# Patient Record
Sex: Male | Born: 1995 | Race: White | Hispanic: No | Marital: Single | Smoking: Never smoker
Health system: Southern US, Community
[De-identification: ages and names within clinical notes are randomized; demographics above are authoritative.]

## PROBLEM LIST (undated history)

## (undated) DIAGNOSIS — L309 Dermatitis, unspecified: Secondary | ICD-10-CM

## (undated) DIAGNOSIS — R51 Headache: Secondary | ICD-10-CM

## (undated) DIAGNOSIS — J45909 Unspecified asthma, uncomplicated: Secondary | ICD-10-CM

## (undated) DIAGNOSIS — Z87442 Personal history of urinary calculi: Secondary | ICD-10-CM

## (undated) HISTORY — DX: Dermatitis, unspecified: L30.9

## (undated) HISTORY — DX: Personal history of urinary calculi: Z87.442

## (undated) HISTORY — DX: Headache: R51

---
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
## 2016-09-09 ENCOUNTER — Encounter: Payer: Self-pay | Admitting: Family Medicine

## 2016-09-09 ENCOUNTER — Ambulatory Visit: Payer: Self-pay | Admitting: Family Medicine

## 2016-09-09 ENCOUNTER — Ambulatory Visit (INDEPENDENT_AMBULATORY_CARE_PROVIDER_SITE_OTHER): Payer: BLUE CROSS/BLUE SHIELD | Admitting: Family Medicine

## 2016-09-09 VITALS — BP 104/80 | HR 98 | Temp 98.1°F | Wt 292.0 lb

## 2016-09-09 DIAGNOSIS — R51 Headache: Secondary | ICD-10-CM | POA: Diagnosis not present

## 2016-09-09 DIAGNOSIS — R509 Fever, unspecified: Secondary | ICD-10-CM | POA: Diagnosis not present

## 2016-09-09 DIAGNOSIS — J020 Streptococcal pharyngitis: Secondary | ICD-10-CM | POA: Diagnosis not present

## 2016-09-09 DIAGNOSIS — R519 Headache, unspecified: Secondary | ICD-10-CM

## 2016-09-09 DIAGNOSIS — R197 Diarrhea, unspecified: Secondary | ICD-10-CM | POA: Diagnosis not present

## 2016-09-09 LAB — CBC WITH DIFFERENTIAL/PLATELET
BASOS ABS: 0 10*3/uL (ref 0.0–0.1)
Basophils Relative: 0.3 % (ref 0.0–3.0)
EOS PCT: 0.5 % (ref 0.0–5.0)
Eosinophils Absolute: 0 10*3/uL (ref 0.0–0.7)
HCT: 44.6 % (ref 36.0–49.0)
Hemoglobin: 15.4 g/dL (ref 12.0–16.0)
Lymphocytes Relative: 15.2 % — ABNORMAL LOW (ref 24.0–48.0)
Lymphs Abs: 1.1 10*3/uL (ref 0.7–4.0)
MCHC: 34.6 g/dL (ref 31.0–37.0)
MCV: 85.2 fl (ref 78.0–98.0)
MONOS PCT: 9 % (ref 3.0–12.0)
Monocytes Absolute: 0.6 10*3/uL (ref 0.1–1.0)
NEUTROS ABS: 5.3 10*3/uL (ref 1.4–7.7)
Neutrophils Relative %: 75 % — ABNORMAL HIGH (ref 43.0–71.0)
PLATELETS: 274 10*3/uL (ref 150.0–575.0)
RBC: 5.24 Mil/uL (ref 3.80–5.70)
RDW: 13.4 % (ref 11.4–15.5)
WBC: 7.1 10*3/uL (ref 4.5–13.5)

## 2016-09-09 LAB — COMPREHENSIVE METABOLIC PANEL
ALK PHOS: 118 U/L (ref 52–171)
ALT: 24 U/L (ref 0–53)
AST: 20 U/L (ref 0–37)
Albumin: 4.4 g/dL (ref 3.5–5.2)
BILIRUBIN TOTAL: 0.8 mg/dL (ref 0.3–1.2)
BUN: 11 mg/dL (ref 6–23)
CALCIUM: 9.2 mg/dL (ref 8.4–10.5)
CO2: 27 meq/L (ref 19–32)
Chloride: 100 mEq/L (ref 96–112)
Creatinine, Ser: 0.89 mg/dL (ref 0.40–1.50)
GFR: 117.11 mL/min (ref >60.00)
Glucose, Bld: 99 mg/dL (ref 70–99)
Potassium: 3.6 mEq/L (ref 3.5–5.1)
Sodium: 135 mEq/L (ref 135–145)
TOTAL PROTEIN: 7.5 g/dL (ref 6.0–8.3)

## 2016-09-09 LAB — POCT RAPID STREP A (OFFICE): Rapid Strep A Screen: POSITIVE — AB

## 2016-09-09 MED ORDER — AMOXICILLIN 500 MG PO CAPS: 500.0000 mg | ORAL_CAPSULE | Freq: Two times a day (BID) | ORAL | 0 refills | Status: DC

## 2016-09-09 NOTE — Patient Instructions (Addendum)
Drink a sports drink mixed with water (1:1 ratio)  Drink 1-2 ounces every 15 to 30 minutes until your stomach is feeling better and you can tolerate larger quantities Eat easy to digest foods once your stomach is feeling better If you are not feeling better in 24 hours, please call the office Please get in two doses of your antibiotic today and be sure to finish all of it   Strep Throat Strep throat is a bacterial infection of the throat. Your health care provider may call the infection tonsillitis or pharyngitis, depending on whether there is swelling in the tonsils or at the back of the throat. Strep throat is most common during the cold months of the year in children who are 65-54 years of age, but it can happen during any season in people of any age. This infection is spread from person to person (contagious) through coughing, sneezing, or close contact. CAUSES Strep throat is caused by the bacteria called Streptococcus pyogenes. RISK FACTORS This condition is more likely to develop in:  People who spend time in crowded places where the infection can spread easily.  People who have close contact with someone who has strep throat. SYMPTOMS Symptoms of this condition include:  Fever or chills.   Redness, swelling, or pain in the tonsils or throat.  Pain or difficulty when swallowing.  White or yellow spots on the tonsils or throat.  Swollen, tender glands in the neck or under the jaw.  Red rash all over the body (rare). DIAGNOSIS This condition is diagnosed by performing a rapid strep test or by taking a swab of your throat (throat culture test). Results from a rapid strep test are usually ready in a few minutes, but throat culture test results are available after one or two days. TREATMENT This condition is treated with antibiotic medicine. HOME CARE INSTRUCTIONS Medicines  Take over-the-counter and prescription medicines only as told by your health care provider.  Take  your antibiotic as told by your health care provider. Do not stop taking the antibiotic even if you start to feel better.  Have family members who also have a sore throat or fever tested for strep throat. They may need antibiotics if they have the strep infection. Eating and Drinking  Do not share food, drinking cups, or personal items that could cause the infection to spread to other people.  If swallowing is difficult, try eating soft foods until your sore throat feels better.  Drink enough fluid to keep your urine clear or pale yellow. General Instructions  Gargle with a salt-water mixture 3-4 times per day or as needed. To make a salt-water mixture, completely dissolve -1 tsp of salt in 1 cup of warm water.  Make sure that all household members wash their hands well.  Get plenty of rest.  Stay home from school or work until you have been taking antibiotics for 24 hours.  Keep all follow-up visits as told by your health care provider. This is important. SEEK MEDICAL CARE IF:  The glands in your neck continue to get bigger.  You develop a rash, cough, or earache.  You cough up a thick liquid that is green, yellow-brown, or bloody.  You have pain or discomfort that does not get better with medicine.  Your problems seem to be getting worse rather than better.  You have a fever. SEEK IMMEDIATE MEDICAL CARE IF:  You have new symptoms, such as vomiting, severe headache, stiff or painful neck, chest pain, or  shortness of breath.  You have severe throat pain, drooling, or changes in your voice.  You have swelling of the neck, or the skin on the neck becomes red and tender.  You have signs of dehydration, such as fatigue, dry mouth, and decreased urination.  You become increasingly sleepy, or you cannot wake up completely.  Your joints become red or painful.   This information is not intended to replace advice given to you by your health care provider. Make sure you discuss  any questions you have with your health care provider.   Document Released: 10/23/2000 Document Revised: 07/17/2015 Document Reviewed: 02/18/2015 Elsevier Interactive Patient Education 2016 [employer]   Food Choices to Help Relieve Diarrhea, Adult When you have diarrhea, the foods you eat and your eating habits are very important. Choosing the right foods and drinks can help relieve diarrhea. Also, because diarrhea can last up to 7 days, you need to replace lost fluids and electrolytes (such as sodium, potassium, and chloride) in order to help prevent dehydration.  WHAT GENERAL GUIDELINES DO I NEED TO FOLLOW?  Slowly drink 1 cup (8 oz) of fluid for each episode of diarrhea. If you are getting enough fluid, your urine will be clear or pale yellow.  Eat starchy foods. Some good choices include white rice, white toast, pasta, low-fiber cereal, baked potatoes (without the skin), saltine crackers, and bagels.  Avoid large servings of any cooked vegetables.  Limit fruit to two servings per day. A serving is  cup or 1 small piece.  Choose foods with less than 2 g of fiber per serving.  Limit fats to less than 8 tsp (38 g) per day.  Avoid fried foods.  Eat foods that have probiotics in them. Probiotics can be found in certain dairy products.  Avoid foods and beverages that may increase the speed at which food moves through the stomach and intestines (gastrointestinal tract). Things to avoid include:  High-fiber foods, such as dried fruit, raw fruits and vegetables, nuts, seeds, and whole grain foods.  Spicy foods and high-fat foods.  Foods and beverages sweetened with high-fructose corn syrup, honey, or sugar alcohols such as xylitol, sorbitol, and mannitol. WHAT FOODS ARE RECOMMENDED? Grains White rice. White, [place]French, or pita breads (fresh or toasted), including plain rolls, buns, or bagels. White pasta. Saltine, soda, or graham crackers. Pretzels. Low-fiber cereal. Cooked cereals  made with water (such as cornmeal, farina, or cream cereals). Plain muffins. Matzo. Melba toast. Zwieback.  Vegetables Potatoes (without the skin). Strained tomato and vegetable juices. Most well-cooked and canned vegetables without seeds. Tender lettuce. Fruits Cooked or canned applesauce, apricots, cherries, fruit cocktail, grapefruit, peaches, pears, or plums. Fresh bananas, apples without skin, cherries, grapes, cantaloupe, grapefruit, peaches, oranges, or plums.  Meat and Other Protein Products Baked or boiled chicken. Eggs. Tofu. Fish. Seafood. Smooth peanut butter. Ground or well-cooked tender beef, ham, veal, lamb, pork, or poultry.  Dairy Plain yogurt, kefir, and unsweetened liquid yogurt. Lactose-free milk, buttermilk, or soy milk. Plain hard cheese. Beverages Sport drinks. Clear broths. Diluted fruit juices (except prune). Regular, caffeine-free sodas such as ginger ale. Water. Decaffeinated teas. Oral rehydration solutions. Sugar-free beverages not sweetened with sugar alcohols. Other Bouillon, broth, or soups made from recommended foods.  The items listed above may not be a complete list of recommended foods or beverages. Contact your dietitian for more options. WHAT FOODS ARE NOT RECOMMENDED? Grains Whole grain, whole wheat, bran, or rye breads, rolls, pastas, crackers, and cereals. Wild or brown  rice. Cereals that contain more than 2 g of fiber per serving. Corn tortillas or taco shells. Cooked or dry oatmeal. Granola. Popcorn. Vegetables Raw vegetables. Cabbage, broccoli, Brussels sprouts, artichokes, baked beans, beet greens, corn, kale, legumes, peas, sweet potatoes, and yams. Potato skins. Cooked spinach and cabbage. Fruits Dried fruit, including raisins and dates. Raw fruits. Stewed or dried prunes. Fresh apples with skin, apricots, mangoes, pears, raspberries, and strawberries.  Meat and Other Protein Products Chunky peanut butter. Nuts and seeds. Beans and lentils. Tomasa BlaseBacon.    Dairy High-fat cheeses. Milk, chocolate milk, and beverages made with milk, such as milk shakes. Cream. Ice cream. Sweets and Desserts Sweet rolls, doughnuts, and sweet breads. Pancakes and waffles. Fats and Oils Butter. Cream sauces. Margarine. Salad oils. Plain salad dressings. Olives. Avocados.  Beverages Caffeinated beverages (such as coffee, tea, soda, or energy drinks). Alcoholic beverages. Fruit juices with pulp. Prune juice. Soft drinks sweetened with high-fructose corn syrup or sugar alcohols. Other Coconut. Hot sauce. Chili powder. Mayonnaise. Gravy. Cream-based or milk-based soups.  The items listed above may not be a complete list of foods and beverages to avoid. Contact your dietitian for more information. WHAT SHOULD I DO IF I BECOME DEHYDRATED? Diarrhea can sometimes lead to dehydration. Signs of dehydration include dark urine and dry mouth and skin. If you think you are dehydrated, you should rehydrate with an oral rehydration solution. These solutions can be purchased at pharmacies, retail stores, or online.  Drink -1 cup (120-240 mL) of oral rehydration solution each time you have an episode of diarrhea. If drinking this amount makes your diarrhea worse, try drinking smaller amounts more often. For example, drink 1-3 tsp (5-15 mL) every 5-10 minutes.  A general rule for staying hydrated is to drink 1-2 L of fluid per day. Talk to your health care provider about the specific amount you should be drinking each day. Drink enough fluids to keep your urine clear or pale yellow.   This information is not intended to replace advice given to you by your health care provider. Make sure you discuss any questions you have with your health care provider.   Document Released: 01/16/2004 Document Revised: 11/16/2014 Document Reviewed: 09/18/2013 Elsevier Interactive Patient Education [employer] Elsevier Inc.

## 2016-09-09 NOTE — Progress Notes (Signed)
Subjective:    Patient ID: Jacob Holden, male    DOB: 1996/08/28, 20 y.o.   MRN: [id number]  HPI This is a 20 yo male who presents today with 2 days of headache, nausea/vomting (dry heaves). He goes to college and works second shift at [employer]. He has not eaten anything in two days due to his work schedule and feeling bad. Has had decreased appetite. Had diarrhea x 5-6 yesterday. Watery brown. Had to finish his shift at [employer] last night. Got off work at 10:45 pm last night and slept until 10 am today. Continues to have headache at back of head and over eyes. Has been drinking water regularly. Felt dizzy when he got up this morning and currently feels light headed- worse with movement. His older sister brought him to his appointment today and is waiting in the car for him. Thinks he had fever last night (chills), feels jittery, no sore throat, no ear pain, no runny nose, slight cough, stomach not currently hurting. No sick contacts. Two episodes of diarrhea today. Headaches different than his regular headache. Had ibuprofen 600 mg last night before going to sleep and slept well last night.  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 Enjoys his school work. Works at [employer] for [employer] reimbursement.  He has a chronic rash on his abdomen. Reports that he has been to dermatologist and has a MRSA like problem that is not contagious that he take a daily, unknown medicine (? Docy) for.   Past Medical History:  Diagnosis Date  . Asthma   . [id number](478.2Headache(784.0)    Past Surgical History:  Procedure Laterality Date  . KNEE SURGERY Right 2014  . LAPAROSCOPIC APPENDECTOMY N/A 09/24/2014   Procedure: APPENDECTOMY LAPAROSCOPIC;  Surgeon: Judie PetitM. Leonia CoronaShuaib Farooqui, MD;  Location: [facility] OR;  Service: Pediatrics;  Laterality: N/A;   Family History  Problem Relation Age of Onset  . Alzheimer's disease Paternal Grandfather     Died at 3770  . Alzheimer's disease Paternal  Grandmother     Died at 5673   Social History  Substance Use Topics  . Smoking status: Never Smoker  . Smokeless tobacco: Never Used  . Alcohol use No      Review of Systems Per HPI    Objective:   Physical Exam  Constitutional: He is oriented to person, place, and time. He appears well-developed and well-nourished. No distress.  Obese. Face very flushed.   HENT:  Head: Normocephalic and atraumatic.  Right Ear: Tympanic membrane, external ear and ear canal normal.  Left Ear: Tympanic membrane, external ear and ear canal normal.  Nose: Nose normal.  Mouth/Throat: Uvula is midline. Mucous membranes are dry. Posterior oropharyngeal erythema present. No oropharyngeal exudate or posterior oropharyngeal edema.  Eyes: Conjunctivae are normal.  Neck: Normal range of motion. Neck supple.  Full ROM of neck.   Cardiovascular: Normal rate, regular rhythm and normal heart sounds.   Pulmonary/Chest: Effort normal and breath sounds normal.  Abdominal: Soft. Bowel sounds are normal. He exhibits no distension. There is tenderness (mild generalized ). There is no rebound and no guarding.  Musculoskeletal: Normal range of motion.  Lymphadenopathy:    He has no cervical adenopathy.  Neurological: He is alert and oriented to person, place, and time.  Skin: Skin is warm and dry. He is not diaphoretic.  Psychiatric: He has a normal mood and affect. His behavior is normal. Judgment and thought content normal.  Vitals  reviewed.    BP 104/80   Pulse 98   Temp 98.1 F (36.7 C)   Wt 292 lb (132.5 kg)   SpO2 96%  Wt Readings from Last 3 Encounters:  09/09/16 292 lb (132.5 kg) (>99 %, Z > 2.33)*  09/24/14 280 lb 10.3 oz (127.3 kg) (>99 %, Z > 2.33)*  03/01/13 269 lb 6.4 oz (122.2 kg) (>99 %, Z > 2.33)*   * Growth percentiles are based on CDC 2-20 Years data.    Results for orders placed or performed in visit on 09/09/16  POCT rapid strep A  Result Value Ref Range   Rapid Strep A Screen  Positive (A) Negative       Assessment & Plan:  1. Diarrhea, unspecified type - provided information regarding oral rehydration and slow introduction of low residue foods - CBC with Differential/Platelet - Comprehensive metabolic panel  2. Acute nonintractable headache, unspecified headache type - likely related to strep, continue ibuprofen,  hydrate - POCT rapid strep A  3. Fever, unspecified fever cause - CBC with Differential/Platelet - Comprehensive metabolic panel - POCT rapid strep A  4. Streptococcal sore throat - Provided written and verbal information regarding diagnosis and treatment. - amoxicillin (AMOXIL) 500 MG capsule; Take 1 capsule (500 mg total) by mouth 2 (two) times daily.  Dispense: 20 capsule; Refill: 0 - RTC precautions reviewed  Olean Reeeborah Balian Schaller, FNP-BC  [facility], [facility]  09/09/2016 12:07 PM

## 2016-09-10 ENCOUNTER — Encounter: Payer: Self-pay | Admitting: Family Medicine

## 2016-09-10 ENCOUNTER — Telehealth: Payer: Self-pay

## 2016-09-10 NOTE — Telephone Encounter (Signed)
Called and spoke with both Jacob Holden and his mother. He has had 3 doses of antibiotics, continues to run a fever, have headache responsive to ibuprofen 600 mg. Has had approximately 8 episodes of diarrhea (watery brown). No vomiting or nausea. Keeping down soup and Gatorade/Propel. Was instructed to continue antibiotic/ibuprofen. Can alternate acetaminophen. Can take otc antidiarrheal. RTC/ED if not improved in 24 hours, sooner if worsening.

## 2016-09-10 NOTE — Telephone Encounter (Signed)
Pts mom (Do not see DPR) said pt was seen 09/09/16; pt was able to eat soup this morning but went back to bed; pt still complaining of H/A,nausea and diarrhea; pt had fever last night and took Advil; pts mom thought would be better after taking abx; advised abx may take 2 days to get into pts system.pts mom wants to know if anything else should be doing to feel better and pt needs note for work and school from 09/09/16 - 09/11/16. Pt hopes to return to work on 09/14/16. Pt does not work or go to school on weekends. Pt mom request cb.

## 2016-12-10 ENCOUNTER — Encounter (INDEPENDENT_AMBULATORY_CARE_PROVIDER_SITE_OTHER): Payer: Self-pay

## 2016-12-10 ENCOUNTER — Ambulatory Visit (INDEPENDENT_AMBULATORY_CARE_PROVIDER_SITE_OTHER): Payer: BLUE CROSS/BLUE SHIELD | Admitting: Internal Medicine

## 2016-12-10 ENCOUNTER — Encounter: Payer: Self-pay | Admitting: Internal Medicine

## 2016-12-10 VITALS — BP 114/76 | HR 102 | Temp 98.2°F | Wt 291.0 lb

## 2016-12-10 DIAGNOSIS — J0100 Acute maxillary sinusitis, unspecified: Secondary | ICD-10-CM | POA: Diagnosis not present

## 2016-12-10 MED ORDER — PREDNISONE 10 MG PO TABS: ORAL_TABLET | ORAL | 0 refills | Status: DC

## 2016-12-10 MED ORDER — DOXYCYCLINE HYCLATE 100 MG PO TABS: 100.0000 mg | ORAL_TABLET | Freq: Two times a day (BID) | ORAL | 0 refills | Status: DC

## 2016-12-10 NOTE — Progress Notes (Signed)
HPI  Pt presents to the clinic with c/o headache, ear fullness, nasal congestion, sore throat and cough. This started 1-2 weeks ago. He is blowing green mucous out of his nose. He denies ear pain or decreased hearing. He denies difficult swallowing. The cough is productive of green mucous. He hasrun fevers up to 101.0. He denies chills but has had body aches. He has tried Mucinex and Nyquil with minimal relief. He reports he [facility]. He was put on a zpack but reports only minor improvement. He has a history of asthma. He has not had sick contacts that he is aware of .  Review of Systems     Past Medical History:  Diagnosis Date  . Asthma   . Headache(784.0)     Family History  Problem Relation Age of Onset  . Alzheimer's disease Paternal Grandfather     Died at 7970  . Alzheimer's disease Paternal Grandmother     Died at 273    Social History   Social History  . Marital status: Single    Spouse name: N/A  . Number of children: N/A  . Years of education: N/A   Occupational History  . Not on file.   Social History Main Topics  . Smoking status: Never Smoker  . Smokeless tobacco: Never Used  . Alcohol use No  . Drug use: No  . Sexual activity: No   Other Topics Concern  . Not on file   Social History Narrative  . No narrative on file    Allergies  Allergen Reactions  . Augmentin [Amoxicillin-Pot Clavulanate] Diarrhea  . Sulfa Antibiotics Nausea Only     Constitutional: Positive headache, fatigue and fever. Denies abrupt weight changes.  HEENT:  Positive ear fullness, nasal congestion and sore throat. Denies eye redness, ear pain, ringing in the ears, wax buildup, runny nose or bloody nose. Respiratory: Positive cough. Denies difficulty breathing or shortness of breath.  Cardiovascular: Denies chest pain, chest tightness, palpitations or swelling in the hands or feet.   No other specific complaints in a complete review of systems (except  as listed in HPI above).  Objective:   BP 114/76   Pulse (!) 102   Temp 98.2 F (36.8 C) (Oral)   Wt 291 lb (132 kg)   SpO2 97%   General: Appears his stated age,  in NAD. HEENT: Head: normal shape and size, maxillary sinus tenderness noted; Eyes: sclera white, no icterus, conjunctiva pink; Ears: Tm's gray and intact, normal light reflex, + serous effusion bilaterally; Nose: mucosa boggy and moist, septum midline; Throat/Mouth: + PND. Teeth present, mucosa erythematous and moist, no exudate noted, no lesions or ulcerations noted.  Neck:  No adenopathy noted.  Cardiovascular: Normal rate and rhythm.  Pulmonary/Chest: Normal effort and positive vesicular breath sounds with bilateral expiratory wheezing noted. No respiratory distress. No rales or ronchi noted.       Assessment & Plan:   Acute maxillary sinusitis  Can use a Neti Pot which can be purchased from your local drug store. eRx for Pred Taper x 6 days eRx for Doxycycline BID for 10 days Continue Nyquil for cough  RTC as needed or if symptoms persist. Nicki ReaperBAITY, Kenisha Lynds, NP

## 2016-12-10 NOTE — Patient Instructions (Signed)

## 2017-03-16 ENCOUNTER — Ambulatory Visit (INDEPENDENT_AMBULATORY_CARE_PROVIDER_SITE_OTHER): Payer: BLUE CROSS/BLUE SHIELD | Admitting: Family Medicine

## 2017-03-16 ENCOUNTER — Encounter (INDEPENDENT_AMBULATORY_CARE_PROVIDER_SITE_OTHER): Payer: Self-pay

## 2017-03-16 ENCOUNTER — Encounter: Payer: Self-pay | Admitting: Family Medicine

## 2017-03-16 VITALS — BP 120/68 | HR 94 | Temp 98.4°F | Ht 70.0 in | Wt 288.5 lb

## 2017-03-16 DIAGNOSIS — J4520 Mild intermittent asthma, uncomplicated: Secondary | ICD-10-CM | POA: Diagnosis not present

## 2017-03-16 NOTE — Progress Notes (Signed)
Pre visit review using our clinic review tool, if applicable. No additional management support is needed unless otherwise documented below in the visit note. 

## 2017-03-16 NOTE — Progress Notes (Signed)
New patient.  Requesting records.   Asthma.  Rare use of SABA unless sig allergy sx, seasonal allergies, usually much better with preventive zyrtec use.  No history of hospitalization. No history of intubation. No history of need for long-term preventive inhaler. Not short of breath. Nonsmoker.  Discussed with patient about routine vaccination. Specifically discussed tetanus, meningitis, HPV, flu. Will check old records. Flu shot recommended given his history of asthma.  History of dry skin noted. Worse in the wintertime. Treated with OTC lotion. Discussed with patient that this was more common in patients with asthma.  PMH and SH reviewed  ROS: Per HPI unless specifically indicated in ROS section   Meds, vitals, and allergies reviewed.   GEN: nad, alert and oriented HEENT: mucous membranes moist NECK: supple w/o LA CV: rrr.  PULM: ctab, no inc wob, no wheeze.  ABD: soft, +bs EXT: no edema SKIN: no acute rash but chronic dry skin noted.

## 2017-03-16 NOTE — Patient Instructions (Signed)
I will check back on your records and vaccines.  Take care.  Glad to see you.  I would get a flu shot each fall.   Update me as needed.

## 2017-03-17 DIAGNOSIS — J45909 Unspecified asthma, uncomplicated: Secondary | ICD-10-CM | POA: Insufficient documentation

## 2017-03-17 NOTE — Assessment & Plan Note (Signed)
Continue antihistamines daily. Continue as needed albuterol treatment. If needed more than a few times per week, he will let me know. Lungs are clear. Okay for outpatient follow-up. Flu shot encouraged. Will check old records regarding previous vaccination. Records are pending at this point. He agrees with plan. >30 minutes spent in face to face time with patient, >50% spent in counselling or coordination of care.

## 2017-03-17 NOTE — Assessment & Plan Note (Signed)
Discussed with patient about diet and exercise. 

## 2017-03-31 ENCOUNTER — Telehealth: Payer: Self-pay

## 2017-03-31 NOTE — Telephone Encounter (Signed)
I called to speak to the patient's Mom about something for her. She asked if we had received his previous records and have we decided if her needed to come back in to get re-established/CPE.

## 2017-04-01 NOTE — Telephone Encounter (Signed)
It looks like he needs some vaccinations more than anything else. He had a Tdap on 08-13-2010.  Please enter in EMR. He is up-to-date on that.  I don't see where he has had meningitis or HPV vaccination. All are reasonable and clearly encouraged if not already done.  He should get a flu shot this/each fall.  The other issue is varicella. I don't see where he has had previous VZV vaccination. Please clarify if he had chickenpox previously.  Thanks.

## 2017-04-02 ENCOUNTER — Encounter: Payer: Self-pay | Admitting: Family Medicine

## 2017-04-02 NOTE — Telephone Encounter (Signed)
Left detailed message on voicemail.  

## 2018-06-17 ENCOUNTER — Encounter: Payer: Self-pay | Admitting: Family Medicine

## 2018-06-17 ENCOUNTER — Ambulatory Visit: Payer: BLUE CROSS/BLUE SHIELD | Admitting: Family Medicine

## 2018-06-17 VITALS — BP 106/66 | HR 64 | Temp 97.9°F | Ht 70.0 in | Wt 290.5 lb

## 2018-06-17 DIAGNOSIS — R109 Unspecified abdominal pain: Secondary | ICD-10-CM

## 2018-06-17 LAB — POC URINALSYSI DIPSTICK (AUTOMATED)
Bilirubin, UA: NEGATIVE
Glucose, UA: NEGATIVE
Ketones, UA: NEGATIVE
Leukocytes, UA: NEGATIVE
NITRITE UA: NEGATIVE
PH UA: 6 (ref 5.0–8.0)
Protein, UA: NEGATIVE
RBC UA: NEGATIVE
UROBILINOGEN UA: 0.2 U/dL

## 2018-06-17 MED ORDER — RANITIDINE HCL 75 MG PO TABS: 75.0000 mg | ORAL_TABLET | Freq: Two times a day (BID) | ORAL | Status: DC

## 2018-06-17 NOTE — Progress Notes (Signed)
Sharp abdominal pain and cramping.  Started in the last two days.  Worse right after eating, for about 1-2 hours after eating.  Not constipated.  No blood in stools.  No black stools.  No vomiting.  No nausea.  Pain in the lower abd, left, right and middle.  The pain comes and goes.  No burning with urination.  No urinary changes.  Pain is better laying down.  Pain doesn't wake him at night.  Rare nsaids overall, occ used for lower back pain.    He does a lot of lifting at work.    H/o appendectomy noted.    PMH and SH reviewed  ROS: Per HPI unless specifically indicated in ROS section   Meds, vitals, and allergies reviewed.   GEN: nad, alert and oriented HEENT: mucous membranes moist NECK: supple w/o LA CV: rrr.  no murmur PULM: ctab, no inc wob ABD: soft, +bs, lower abdominal exam noted to be mildly tender in the left right and midline.  No rebound.  Upper abdomen, specifically right upper quadrant, upper midline and left upper quadrant not tender.  No CVA pain.  He does not have abdominal pain when he raises both legs off the exam table. EXT: no edema SKIN: no acute rash

## 2018-06-17 NOTE — Patient Instructions (Signed)
Go to the lab on the way out.  We'll contact you with your lab report. Avoid spicy or fatty foods for now.  Eat bland foods and not large portions.  Start taking zantac 75mg  twice a day.  Update me if not better.  Take care.  Glad to see you.

## 2018-06-18 LAB — CBC WITH DIFFERENTIAL/PLATELET
BASOS ABS: 68 {cells}/uL (ref 0–200)
Basophils Relative: 1 %
EOS ABS: 143 {cells}/uL (ref 15–500)
EOS PCT: 2.1 %
HCT: 47.3 % (ref 38.5–50.0)
Hemoglobin: 15.8 g/dL (ref 13.2–17.1)
Lymphs Abs: 2516 cells/uL (ref 850–3900)
MCH: 28.9 pg (ref 27.0–33.0)
MCHC: 33.4 g/dL (ref 32.0–36.0)
MCV: 86.5 fL (ref 80.0–100.0)
MPV: 9.7 fL (ref 7.5–12.5)
Monocytes Relative: 11.4 %
NEUTROS PCT: 48.5 %
Neutro Abs: 3298 cells/uL (ref 1500–7800)
Platelets: 322 10*3/uL (ref 140–400)
RBC: 5.47 10*6/uL (ref 4.20–5.80)
RDW: 12.7 % (ref 11.0–15.0)
Total Lymphocyte: 37 %
WBC mixed population: 775 cells/uL (ref 200–950)
WBC: 6.8 10*3/uL (ref 3.8–10.8)

## 2018-06-18 LAB — COMPREHENSIVE METABOLIC PANEL
AG RATIO: 1.6 (calc) (ref 1.0–2.5)
ALT: 25 U/L (ref 9–46)
AST: 18 U/L (ref 10–40)
Albumin: 4.6 g/dL (ref 3.6–5.1)
Alkaline phosphatase (APISO): 104 U/L (ref 40–115)
BILIRUBIN TOTAL: 0.5 mg/dL (ref 0.2–1.2)
BUN: 11 mg/dL (ref 7–25)
CALCIUM: 9.7 mg/dL (ref 8.6–10.3)
CO2: 28 mmol/L (ref 20–32)
Chloride: 105 mmol/L (ref 98–110)
Creat: 0.8 mg/dL (ref 0.60–1.35)
Globulin: 2.8 g/dL (calc) (ref 1.9–3.7)
Glucose, Bld: 94 mg/dL (ref 65–99)
POTASSIUM: 4.4 mmol/L (ref 3.5–5.3)
Sodium: 139 mmol/L (ref 135–146)
Total Protein: 7.4 g/dL (ref 6.1–8.1)

## 2018-06-18 LAB — LIPASE: Lipase: 9 U/L (ref 7–60)

## 2018-06-19 DIAGNOSIS — R109 Unspecified abdominal pain: Secondary | ICD-10-CM | POA: Insufficient documentation

## 2018-06-19 NOTE — Assessment & Plan Note (Signed)
We talked about his situation.  He has symptoms that could be suggestive of gallbladder/upper GI source such as reflux, but his exam is noted for lower abdominal tenderness without any upper abdominal tenderness at all. Reasonable to recheck routine labs for now to see if they are remarkable or revealing. Avoid spicy or fatty foods for now in case this could be gallbladder or reflux related. Eat bland foods and not large portions.  Start taking zantac 75mg  twice a day.  Update me if not better.  See notes on labs.  He agrees.  Differential and plan discussed with patient.  All questions answered to the best my ability.  Still well-appearing and okay for outpatient follow-up.

## 2018-09-14 ENCOUNTER — Encounter: Payer: Self-pay | Admitting: Family Medicine

## 2018-09-14 ENCOUNTER — Ambulatory Visit: Payer: BLUE CROSS/BLUE SHIELD | Admitting: Family Medicine

## 2018-09-14 DIAGNOSIS — R05 Cough: Secondary | ICD-10-CM

## 2018-09-14 DIAGNOSIS — R059 Cough, unspecified: Secondary | ICD-10-CM

## 2018-09-14 MED ORDER — BENZONATATE 200 MG PO CAPS
200.0000 mg | ORAL_CAPSULE | Freq: Three times a day (TID) | ORAL | 0 refills | Status: DC | PRN
Start: 2018-09-14 — End: 2024-01-13

## 2018-09-14 MED ORDER — DOXYCYCLINE HYCLATE 100 MG PO TABS: 100.0000 mg | ORAL_TABLET | Freq: Two times a day (BID) | ORAL | 0 refills | Status: DC

## 2018-09-14 NOTE — Progress Notes (Signed)
Cough.  Started about 1 week ago.  Worse in the meantime.  Out of work today.  Discolored sputum.  No known fevers.  Hasn't had to use SABA, no wheeze.  Stuffy.  Chest congestion.  No ear pain but stuffy.  Facial pain, maxillary.  No vomiting, no diarrhea.  Mild aches.  No rash.  Appetite is lower than normal.   Meds, vitals, and allergies reviewed.   ROS: Per HPI unless specifically indicated in ROS section   GEN: nad, alert and oriented HEENT: mucous membranes moist, tm w/o erythema, nasal exam w/o erythema, clear discharge noted,  OP with cobblestoning NECK: supple w/o LA CV: rrr.   PULM: ctab, no inc wob EXT: no edema SKIN: no acute rash Sinuses not tender to palpation.

## 2018-09-14 NOTE — Patient Instructions (Signed)
Rest, fluids.  Use the inhaler if needed.  Tessalon for the cough.   Start doxycycline in the meantime.  Update me as needed.  Take care.  Glad to see you.

## 2018-09-15 DIAGNOSIS — R059 Cough, unspecified: Secondary | ICD-10-CM | POA: Insufficient documentation

## 2018-09-15 DIAGNOSIS — R05 Cough: Secondary | ICD-10-CM | POA: Insufficient documentation

## 2018-09-15 NOTE — Assessment & Plan Note (Addendum)
Presumed bronchitis.  Nontoxic.  Given duration would start doxycycline.  Can use Tessalon as needed for cough.  Use albuterol if needed for cough.  Rest and fluids.  Update me as needed.  He agrees.  Okay for outpatient follow-up.  Routine cautions discussed with patient.

## 2018-10-18 ENCOUNTER — Ambulatory Visit (INDEPENDENT_AMBULATORY_CARE_PROVIDER_SITE_OTHER): Payer: BLUE CROSS/BLUE SHIELD | Admitting: Family Medicine

## 2018-10-18 ENCOUNTER — Encounter: Payer: Self-pay | Admitting: Family Medicine

## 2018-10-18 VITALS — BP 114/70 | HR 90 | Temp 98.3°F | Ht 70.0 in | Wt 294.0 lb

## 2018-10-18 DIAGNOSIS — J069 Acute upper respiratory infection, unspecified: Secondary | ICD-10-CM | POA: Insufficient documentation

## 2018-10-18 MED ORDER — AZITHROMYCIN 250 MG PO TABS
ORAL_TABLET | ORAL | 0 refills | Status: DC
Start: 2018-10-18 — End: 2024-01-13

## 2018-10-18 MED ORDER — PROMETHAZINE-DM 6.25-15 MG/5ML PO SYRP: 5.0000 mL | ORAL_SOLUTION | Freq: Four times a day (QID) | ORAL | 0 refills | Status: DC | PRN

## 2018-10-18 NOTE — Progress Notes (Signed)
Subjective:    Patient ID: Jacob Holden, male    DOB: 1996/01/10, 22 y.o.   MRN: [id number]  HPI Here with c/o of cough -4d  22 yo pt of Dr Para March with h/o asthma  Had bronchitis a month ago - doxycycline   Was better for about 1-2 weeks before he got sick again  Exposed to niece with pneumonia  Mom may be getting sick also    Some nasal congestion  Throat hurt for 2 d- better now  Ears feel full  Much worse in chest  Cough is productive - phlegm is dark green in color to yellow  Some wheezing -not bad (worse with cough)  Could not work today  Frequent headaches - sinus headache worse above eyes  Very tired  Temp just over 100  Upset stomach   Otc:  guaifenesin  Tylenol cold at night  Tried 2 tessalon    Patient Active Problem List   Diagnosis Date Noted  . Upper respiratory infection 10/18/2018  . Cough 09/15/2018  . Abdominal pain 06/19/2018  . Asthma 03/17/2017  . Morbid obesity with body mass index of 40.0-49.9 (HCC) 09/25/2014   Past Medical History:  Diagnosis Date  . Asthma   . Headache(784.0)   . History of renal stone    Past Surgical History:  Procedure Laterality Date  . KNEE SURGERY Right 2014  . LAPAROSCOPIC APPENDECTOMY N/A 09/24/2014   Procedure: APPENDECTOMY LAPAROSCOPIC;  Surgeon: Judie Petit. Leonia Corona, MD;  Location: [facility] OR;  Service: Pediatrics;  Laterality: N/A;   Social History   Tobacco Use  . Smoking status: Never Smoker  . Smokeless tobacco: Never Used  Substance Use Topics  . Alcohol use: No  . Drug use: No   Family History  Problem Relation Age of Onset  . Alzheimer's disease Paternal Grandmother        Died at 54  . Alzheimer's disease Paternal Grandfather        Died at 43   Allergies  Allergen Reactions  . Augmentin [Amoxicillin-Pot Clavulanate] Diarrhea  . Sulfa Antibiotics Nausea Only   Current Outpatient Medications on File Prior to Visit  Medication Sig Dispense Refill  . albuterol (PROVENTIL HFA;VENTOLIN  HFA) 108 (90 Base) MCG/ACT inhaler Inhale 1-2 puffs into the lungs every 6 (six) hours as needed for wheezing or shortness of breath.    . cetirizine (ZYRTEC) 10 MG tablet Take 10 mg by mouth daily as needed.     . benzonatate (TESSALON) 200 MG capsule Take 1 capsule (200 mg total) by mouth 3 (three) times daily as needed. (Patient not taking: Reported on 10/18/2018) 30 capsule 0   No current facility-administered medications on file prior to visit.      Review of Systems  Constitutional: Positive for appetite change, fatigue and fever.  HENT: Positive for congestion, postnasal drip, rhinorrhea, sinus pressure, sneezing and sore throat. Negative for ear pain.   Eyes: Negative for pain and discharge.  Respiratory: Positive for cough and wheezing. Negative for chest tightness, shortness of breath and stridor.   Cardiovascular: Negative for chest pain.  Gastrointestinal: Negative for diarrhea, nausea and vomiting.  Genitourinary: Negative for frequency, hematuria and urgency.  Musculoskeletal: Negative for arthralgias and myalgias.  Skin: Negative for rash.  Neurological: Positive for headaches. Negative for dizziness, weakness and light-headedness.  Psychiatric/Behavioral: Negative for confusion and dysphoric mood.       Objective:   Physical Exam  Constitutional: He appears well-developed and well-nourished. No distress.  HENT:  Head: Normocephalic and atraumatic.  Right Ear: External ear normal.  Left Ear: External ear normal.  Mouth/Throat: Oropharynx is clear and moist.  Nares are injected and congested  No sinus tenderness Clear rhinorrhea and post nasal drip   Eyes: Pupils are equal, round, and reactive to light. Conjunctivae and EOM are normal. Right eye exhibits no discharge. Left eye exhibits no discharge.  Neck: Normal range of motion. Neck supple.  Cardiovascular: Normal rate and normal heart sounds.  Pulmonary/Chest: Effort normal and breath sounds normal. No stridor. No  respiratory distress. He has no wheezes. He has no rales. He exhibits no tenderness.  Good air exch No wheeze even on forced exp  No rales/rhonchi  Lymphadenopathy:    He has no cervical adenopathy.  Neurological: He is alert.  Skin: Skin is warm and dry. No rash noted.  Psychiatric: He has a normal mood and affect.          Assessment & Plan:   Problem List Items Addressed This Visit      Respiratory   Upper respiratory infection - Primary    Persistent cough/ low grade temp with recent close exp to pneumonia pt  Cover with zpak in light of exp  prometh dm for cough /caution of sedation Expectorant prn  Off work today  Rest/fluids Disc symptomatic care - see instructions on AVS  Update if not starting to improve in a week or if worsening        Relevant Medications   azithromycin (ZITHROMAX Z-PAK) 250 MG tablet

## 2018-10-18 NOTE — Patient Instructions (Addendum)
You have an upper respiratory infection  Since you were exposed to pneumonia - I have to cover with an antibiotic (zpak)  Drink lots of fluids  Rest   Try prometh DM for cough (caution of sedation)  Guaifenesin is fine also  Take the zpak as directed   Update if not starting to improve in a week or if worsening

## 2018-10-18 NOTE — Assessment & Plan Note (Signed)
Persistent cough/ low grade temp with recent close exp to pneumonia pt  Cover with zpak in light of exp  prometh dm for cough /caution of sedation Expectorant prn  Off work today  Rest/fluids Disc symptomatic care - see instructions on AVS  Update if not starting to improve in a week or if worsening

## 2018-10-25 ENCOUNTER — Encounter: Payer: Self-pay | Admitting: Family Medicine

## 2018-10-25 ENCOUNTER — Ambulatory Visit: Payer: BLUE CROSS/BLUE SHIELD | Admitting: Family Medicine

## 2018-10-25 VITALS — BP 118/80 | HR 78 | Temp 98.5°F | Ht 71.0 in | Wt 285.0 lb

## 2018-10-25 DIAGNOSIS — K529 Noninfective gastroenteritis and colitis, unspecified: Secondary | ICD-10-CM

## 2018-10-25 DIAGNOSIS — R1010 Upper abdominal pain, unspecified: Secondary | ICD-10-CM | POA: Diagnosis not present

## 2018-10-25 DIAGNOSIS — R112 Nausea with vomiting, unspecified: Secondary | ICD-10-CM

## 2018-10-25 DIAGNOSIS — R197 Diarrhea, unspecified: Secondary | ICD-10-CM

## 2018-10-25 MED ORDER — ONDANSETRON 4 MG PO TBDP: 4.0000 mg | ORAL_TABLET | Freq: Three times a day (TID) | ORAL | 0 refills | Status: DC | PRN

## 2018-10-25 NOTE — Progress Notes (Signed)
Subjective:    Patient ID: Jacob Holden, male    DOB: [birth year], 22 y.o.   MRN: [id number]  HPI  Presents to clinic c/o upper ABD pain, vomiting and diarrhea for 3-4 days.   Patient states the vomiting has improved over the past 2 days, but he continues to have upper abdominal pain and also diarrhea.  Patient states he feels like he could run to the bathroom every 30 minutes, and states he has had diarrhea every 30 minutes as well.  Patient has taken 2 doses of Imodium over the past 4 days (last dose was yesterday AM), but states it does not seem to help at all.  Patient denies any known sick contacts, denies any new foods or travel to different places.  Patient states he did have issues with upper abdominal pain in the past, was concerned he could possibly be gallbladder issues due to his mom having her gallbladder removed.  Patient has been trying to drink water and eat toast.  States he has eaten a lot of toast over the past couple of days.  Patient Active Problem List   Diagnosis Date Noted  . Upper respiratory infection 10/18/2018  . Cough 09/15/2018  . Abdominal pain 06/19/2018  . Asthma 03/17/2017  . Morbid obesity with body mass index of 40.0-49.9 (HCC) 09/25/2014   Social History   Tobacco Use  . Smoking status: Never Smoker  . Smokeless tobacco: Never Used  Substance Use Topics  . Alcohol use: No   Review of Systems  Constitutional: +fatigue HENT: Negative for congestion, ear pain, sinus pain and sore throat.   Eyes: Negative.   Respiratory: Negative for cough, shortness of breath and wheezing.   Cardiovascular: Negative for chest pain, palpitations and leg swelling.  Gastrointestinal: +diarrhea, nausea and vomiting. +Upper ABD pain Genitourinary: Negative for dysuria, frequency and urgency.  Musculoskeletal: Negative for arthralgias and myalgias.  Skin: Negative for color change, pallor and rash.  Neurological: Negative for syncope, light-headedness and  headaches.  Psychiatric/Behavioral: The patient is not nervous/anxious.       Objective:   Physical Exam Vitals signs and nursing note reviewed.  Constitutional:      General: He is not in acute distress.    Appearance: He is not ill-appearing, toxic-appearing or diaphoretic.  HENT:     Head: Normocephalic and atraumatic.     Nose: Nose normal.     Mouth/Throat:     Mouth: Mucous membranes are moist.  Eyes:     General: No scleral icterus.    Extraocular Movements: Extraocular movements intact.     Conjunctiva/sclera: Conjunctivae normal.  Neck:     Musculoskeletal: Normal range of motion and neck supple. No neck rigidity.  Cardiovascular:     Rate and Rhythm: Normal rate and regular rhythm.     Heart sounds: Normal heart sounds.  Pulmonary:     Effort: Pulmonary effort is normal. No respiratory distress.     Breath sounds: Normal breath sounds. No wheezing, rhonchi or rales.  Abdominal:     General: Abdomen is flat. Bowel sounds are normal.     Palpations: Abdomen is soft. There is no mass.     Tenderness: There is abdominal tenderness (+RUQ and epigastric). There is no guarding or rebound.     Hernia: No hernia is present.  Musculoskeletal:     Right lower leg: No edema.     Left lower leg: No edema.  Skin:    General: Skin is warm and  dry.     Coloration: Skin is not jaundiced or pale.  Neurological:     Mental Status: He is alert and oriented to person, place, and time.     Gait: Gait normal.  Psychiatric:        Mood and Affect: Mood normal.       Vitals:   10/25/18 1513  BP: 118/80  Pulse: 78  Temp: 98.5 F (36.9 C)  SpO2: 97%   Assessment & Plan:   Upper abdominal pain, nausea, vomiting, diarrhea, gastroenteritis - I suspect that patient's symptoms are related to a viral gastroenteritis.  Vomiting has improved on its own, diarrhea still present, but patient's physical exam is unremarkable and he appears nontoxic.  Patient will get blood work in clinic  today including CBC, CMP, amylase and lipase to further investigate upper abdominal pain.  Patient advised to follow a bland diet with clear liquids over the next few days and then slowly advance diet as tolerated.  Advised to keep good hydration with water, Gatorade, Pedialyte- educated to slowly sip on liquids rather than trying to jog large amounts of liquid at one time, and taking liquid too fast can be too tough on stomach when stomach is upset. patient prescribed Zofran to use if needed for nausea and also advised to continue using Imodium for diarrhea, advised after his next loose stool to take a 4 mg dose and then thereafter he continues a 2 mg dose.   Patient aware he will be contacted with information regarding abdominal ultrasound appointment.  Also aware that he will be contacted in regards to his lab results.

## 2018-10-25 NOTE — Patient Instructions (Signed)

## 2018-10-26 ENCOUNTER — Encounter: Payer: Self-pay | Admitting: Family Medicine

## 2018-10-26 LAB — COMPREHENSIVE METABOLIC PANEL
ALK PHOS: 76 U/L (ref 39–117)
ALT: 86 U/L — AB (ref 0–53)
AST: 63 U/L — ABNORMAL HIGH (ref 0–37)
Albumin: 4.3 g/dL (ref 3.5–5.2)
BILIRUBIN TOTAL: 0.7 mg/dL (ref 0.2–1.2)
BUN: 12 mg/dL (ref 6–23)
CALCIUM: 8.8 mg/dL (ref 8.4–10.5)
CO2: 27 mEq/L (ref 19–32)
CREATININE: 0.85 mg/dL (ref 0.40–1.50)
Chloride: 104 mEq/L (ref 96–112)
GFR: 120.86 mL/min (ref >60.00)
Glucose, Bld: 91 mg/dL (ref 70–99)
Potassium: 3.5 mEq/L (ref 3.5–5.1)
Sodium: 139 mEq/L (ref 135–145)
Total Protein: 7.1 g/dL (ref 6.0–8.3)

## 2018-10-26 LAB — CBC
HCT: 46.6 % (ref 39.0–52.0)
Hemoglobin: 15.9 g/dL (ref 13.0–17.0)
MCHC: 34.2 g/dL (ref 30.0–36.0)
MCV: 85.8 fl (ref 78.0–100.0)
PLATELETS: 300 10*3/uL (ref 150.0–400.0)
RBC: 5.43 Mil/uL (ref 4.22–5.81)
RDW: 13.4 % (ref 11.5–15.5)
WBC: 6.6 10*3/uL (ref 4.0–10.5)

## 2018-10-26 LAB — AMYLASE: Amylase: 66 U/L (ref 27–131)

## 2018-10-26 LAB — LIPASE: LIPASE: 128 U/L — AB (ref 11.0–59.0)

## 2018-11-01 ENCOUNTER — Ambulatory Visit
Admission: RE | Admit: 2018-11-01 | Discharge: 2018-11-01 | Disposition: A | Discharge status: Home / Self Care | Payer: BLUE CROSS/BLUE SHIELD | Source: Ambulatory Visit | Attending: Family Medicine | Admitting: Family Medicine

## 2018-11-01 DIAGNOSIS — R197 Diarrhea, unspecified: Secondary | ICD-10-CM | POA: Diagnosis not present

## 2018-11-01 DIAGNOSIS — R1010 Upper abdominal pain, unspecified: Secondary | ICD-10-CM | POA: Diagnosis present

## 2018-11-01 DIAGNOSIS — R1011 Right upper quadrant pain: Secondary | ICD-10-CM | POA: Diagnosis not present

## 2018-11-01 DIAGNOSIS — R112 Nausea with vomiting, unspecified: Secondary | ICD-10-CM | POA: Insufficient documentation

## 2018-11-10 ENCOUNTER — Ambulatory Visit: Payer: BLUE CROSS/BLUE SHIELD | Admitting: Family Medicine

## 2018-11-14 ENCOUNTER — Other Ambulatory Visit: Payer: Self-pay | Admitting: Nephrology

## 2018-11-14 DIAGNOSIS — R93429 Abnormal radiologic findings on diagnostic imaging of unspecified kidney: Secondary | ICD-10-CM

## 2018-11-16 ENCOUNTER — Ambulatory Visit
Admission: RE | Admit: 2018-11-16 | Discharge: 2018-11-16 | Disposition: A | Discharge status: Home / Self Care | Payer: BLUE CROSS/BLUE SHIELD | Source: Ambulatory Visit | Attending: Nephrology | Admitting: Nephrology

## 2018-11-16 DIAGNOSIS — R93429 Abnormal radiologic findings on diagnostic imaging of unspecified kidney: Secondary | ICD-10-CM | POA: Insufficient documentation

## 2020-07-14 IMAGING — US US RENAL
1 series · 14 of 25 positions shown · non-contrast
Comparison: 11/01/2018

CLINICAL DATA: Echogenic right kidney on ultrasound

EXAM:
RENAL / URINARY TRACT ULTRASOUND COMPLETE

[Series 1: us renal · 0.28mm/px · 14 of 30 slices shown]
[im 1/30]
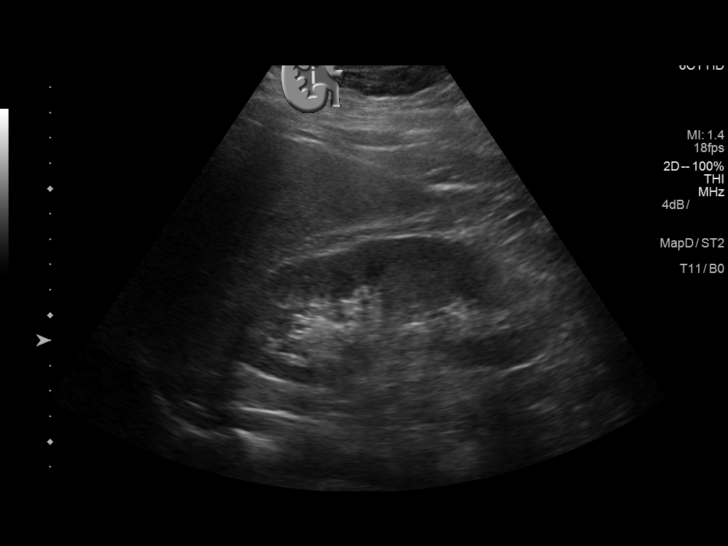
[im 3/30]
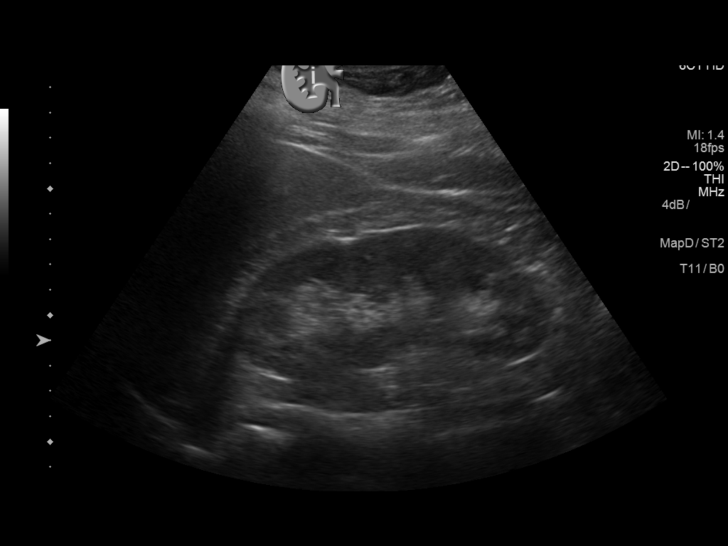
[im 5/30]
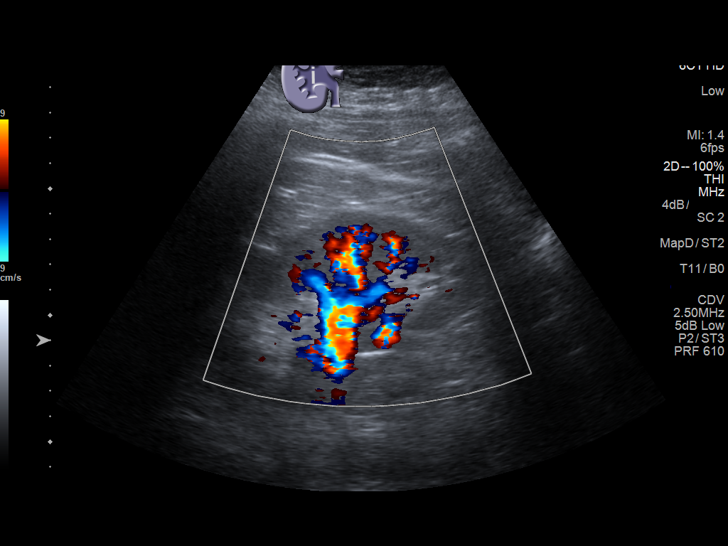
[im 8/30]
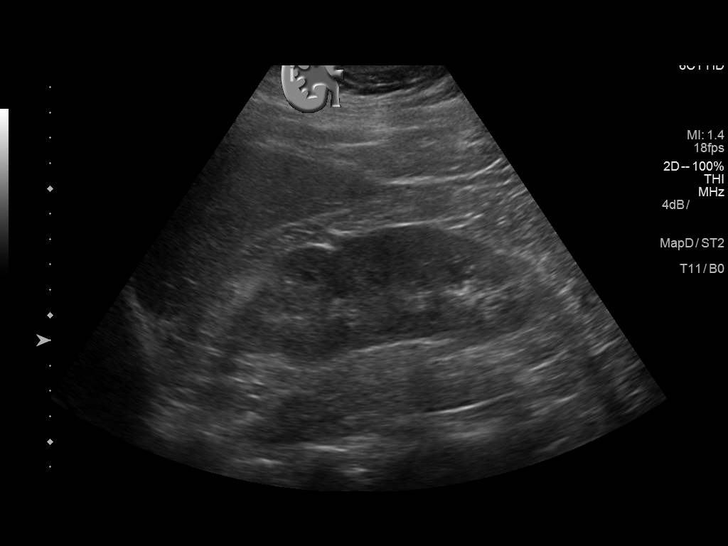
[im 10/30]
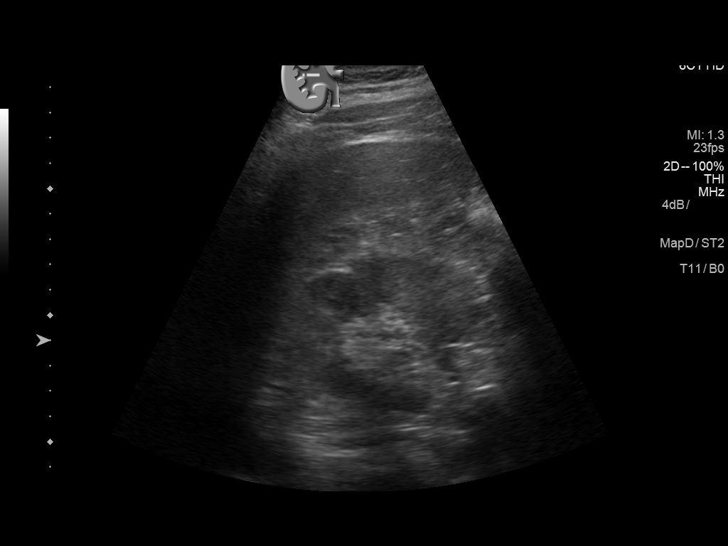
[im 11/30]
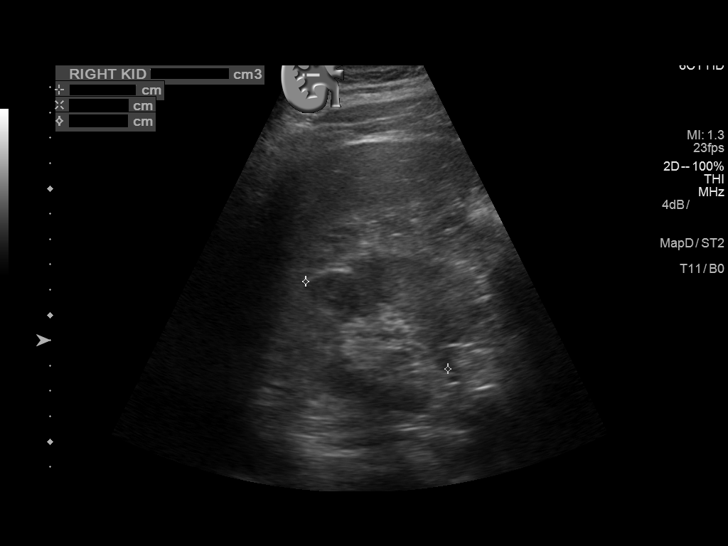
[im 14/30]
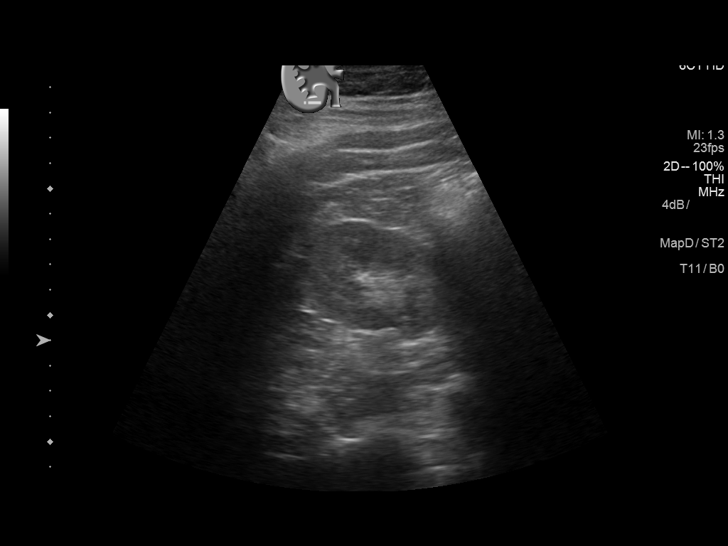
[im 16/30]
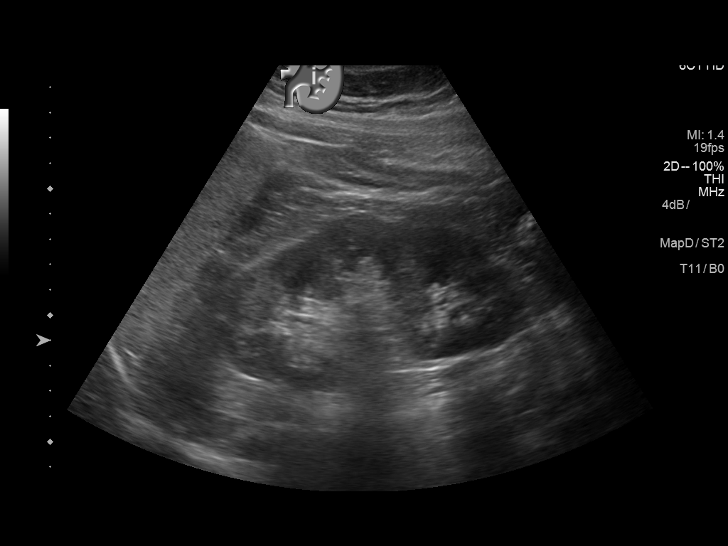
[im 19/30]
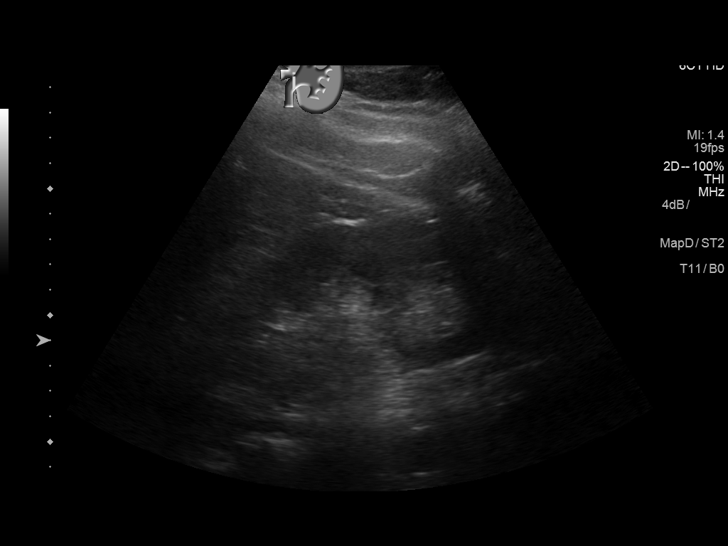
[im 20/30]
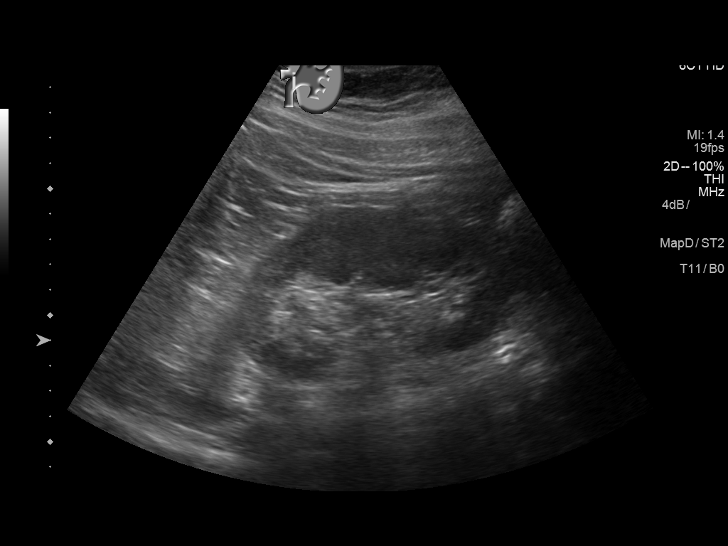
[im 22/30]
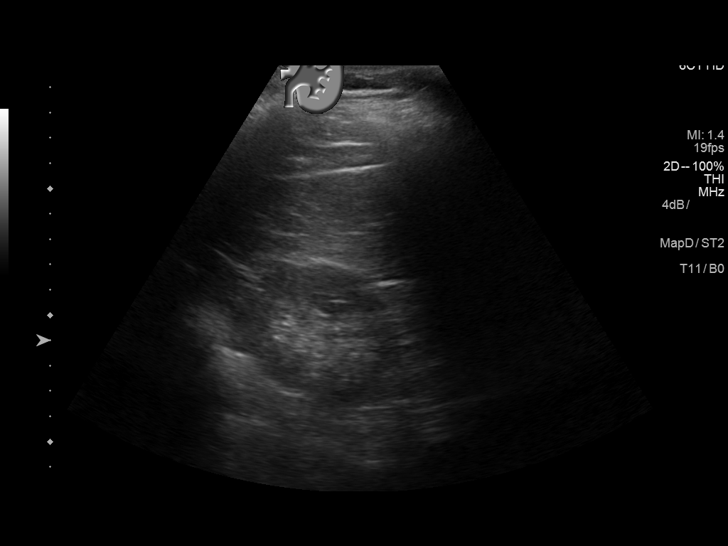
[im 25/30]
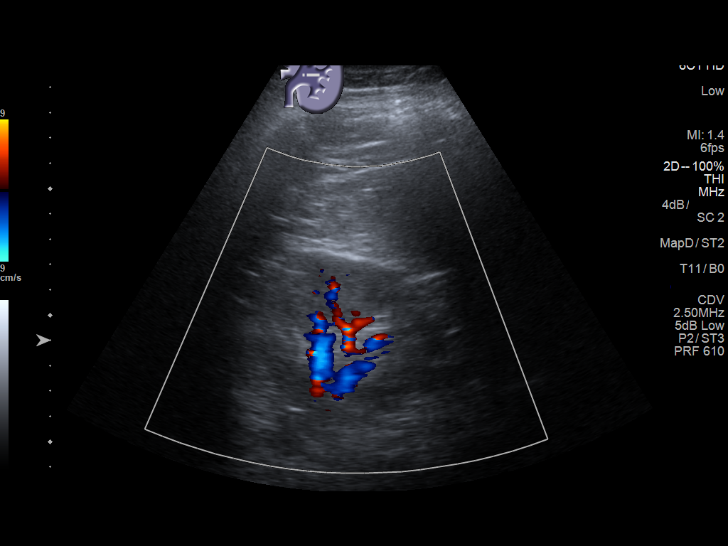
[im 27/30]
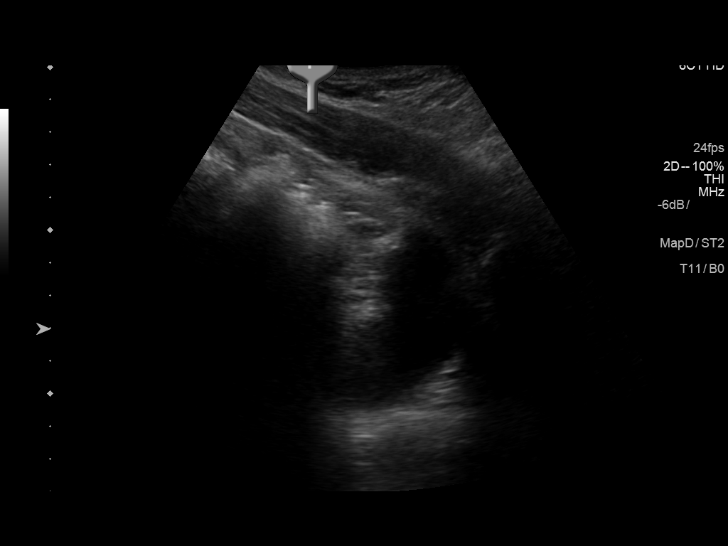
[im 30/30]
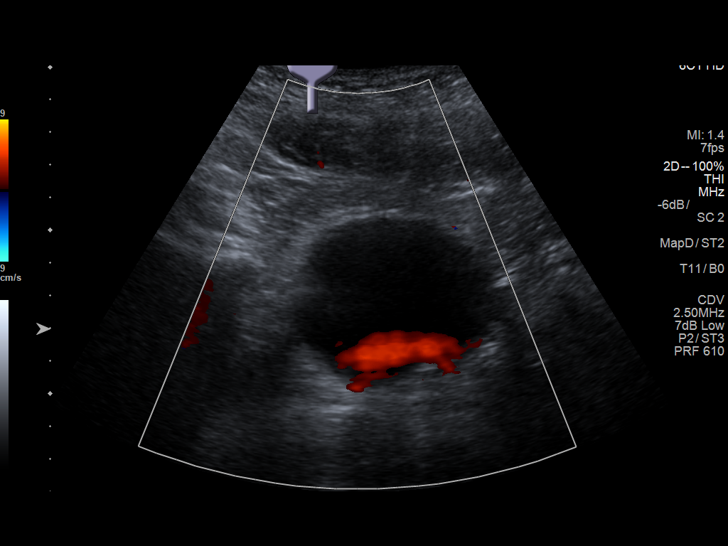

[14 of 25 positions shown; findings below may reference images not displayed]

FINDINGS: Right Kidney:

Renal measurements: 13.0 x 4.7 x 6.6 cm = volume: 211 mL.
Echotexture appears normal on today's study. No mass or
hydronephrosis.

Left Kidney:

Renal measurements: 11.9 x 6.1 x 6.2 cm = volume: 237 mL.
Echogenicity within normal limits. No mass or hydronephrosis
visualized.

Bladder:

Appears normal for degree of bladder distention.
IMPRESSION: Normal study.

## 2023-11-09 ENCOUNTER — Telehealth: Payer: Self-pay | Admitting: Family Medicine

## 2023-11-09 NOTE — Telephone Encounter (Signed)
Spoke to pt, scheduled ov for 01/13/24

## 2023-11-09 NOTE — Telephone Encounter (Signed)
 Is patient able to re-establish care with Dr. Cleatus. Please advise. Thank you!  Copied from CRM [id number]. Topic: General - Other >> Nov 09, 2023  8:38 AM Jacob Holden wrote: Reason for CRM: Pt called stating he would like to schedule an acute appointment but he has not been seen since 2019. Pt was a former [employer] pt. Please advise

## 2023-11-09 NOTE — Telephone Encounter (Signed)
 Please schedule for spring 2025 when possible.  Thanks.

## 2024-01-13 ENCOUNTER — Ambulatory Visit: Payer: Self-pay | Admitting: Family Medicine

## 2024-01-13 ENCOUNTER — Encounter: Payer: Self-pay | Admitting: Family Medicine

## 2024-01-13 VITALS — BP 134/70 | HR 80 | Temp 98.3°F | Ht 70.47 in | Wt 304.4 lb

## 2024-01-13 DIAGNOSIS — Z23 Encounter for immunization: Secondary | ICD-10-CM

## 2024-01-13 DIAGNOSIS — Z7185 Encounter for immunization safety counseling: Secondary | ICD-10-CM

## 2024-01-13 DIAGNOSIS — J4520 Mild intermittent asthma, uncomplicated: Secondary | ICD-10-CM

## 2024-01-13 DIAGNOSIS — L309 Dermatitis, unspecified: Secondary | ICD-10-CM | POA: Diagnosis not present

## 2024-01-13 DIAGNOSIS — L723 Sebaceous cyst: Secondary | ICD-10-CM

## 2024-01-13 DIAGNOSIS — Z7189 Other specified counseling: Secondary | ICD-10-CM

## 2024-01-13 MED ORDER — TRIAMCINOLONE ACETONIDE 0.1 % EX CREA: 1.0000 | TOPICAL_CREAM | Freq: Two times a day (BID) | CUTANEOUS | 1 refills | Status: AC | PRN

## 2024-01-13 NOTE — Progress Notes (Signed)
 Eczema.  Worse in the winter, worse with hot showers.  Worse sx in the summer also.  Itches.  On the trunk.  Also on the elbows and thighs.  He was asking if dairy intake could contribute, d/w pt.    Cyst on the back on the neck, longstanding.  Not draining.  Not painful.  1.5 cm seb cyst.    Rare use of SABA, usually in the winter with an illness.    Tdap today.  Flu encouraged and declined.  He has h/o asthma, wife has cardiomyopathy, and he has a young child at home.  Discussed that he has three good indications to get vaccinated an no medical reason to prevent routine vaccination.  Wife and mother equally designated if patient were incapacitated.   He had labs done out of clinic.  He can get me a copy of those.    Meds, vitals, and allergies reviewed.   ROS: Per HPI unless specifically indicated in ROS section   GEN: nad, alert and oriented HEENT: mucous membranes moist NECK: supple w/o LA CV: rrr PULM: ctab, no inc wob ABD: soft, +bs EXT: no edema SKIN: he has chronic appearing macular rash on the trunk and extremities.   Small seb cyst on the back of the neck, not red or ttp.

## 2024-01-13 NOTE — Patient Instructions (Addendum)
 I would try using triamcinolone cream as needed.  Let me know if that isn't helping. Refer to derm.  Take care.  Glad to see you. Tetanus shot today.  I would get a flu shot each fall.

## 2024-01-16 ENCOUNTER — Encounter: Payer: Self-pay | Admitting: Family Medicine

## 2024-01-16 DIAGNOSIS — L309 Dermatitis, unspecified: Secondary | ICD-10-CM | POA: Insufficient documentation

## 2024-01-16 DIAGNOSIS — L723 Sebaceous cyst: Secondary | ICD-10-CM | POA: Insufficient documentation

## 2024-01-16 DIAGNOSIS — Z7189 Other specified counseling: Secondary | ICD-10-CM | POA: Insufficient documentation

## 2024-01-16 DIAGNOSIS — Z7185 Encounter for immunization safety counseling: Secondary | ICD-10-CM | POA: Insufficient documentation

## 2024-01-16 NOTE — Assessment & Plan Note (Signed)
 Tdap today.  Flu encouraged and declined.  He has h/o asthma, wife has cardiomyopathy, and he has a young child at home.  Discussed that he has three good indications to get vaccinated an no medical reason to prevent routine vaccination.

## 2024-01-16 NOTE — Assessment & Plan Note (Signed)
 Wife and mother equally designated if patient were incapacitated.

## 2024-01-16 NOTE — Assessment & Plan Note (Signed)
 Wouldn't need intervention now, small and not ttp.

## 2024-01-16 NOTE — Assessment & Plan Note (Signed)
 I would try using triamcinolone cream as needed.  Let me know if that isn't helping. Refer to derm.

## 2024-01-16 NOTE — Assessment & Plan Note (Signed)
 Rare use of SABA, usually in the winter with an illness.

## 2024-11-03 ENCOUNTER — Telehealth: Payer: Self-pay | Admitting: Family Medicine

## 2024-11-03 ENCOUNTER — Encounter: Payer: Self-pay | Admitting: Family

## 2024-11-03 ENCOUNTER — Ambulatory Visit: Admitting: Family

## 2024-11-03 VITALS — BP 116/70 | HR 93 | Temp 98.3°F | Ht 70.47 in | Wt 295.4 lb

## 2024-11-03 DIAGNOSIS — R051 Acute cough: Secondary | ICD-10-CM | POA: Diagnosis not present

## 2024-11-03 DIAGNOSIS — J101 Influenza due to other identified influenza virus with other respiratory manifestations: Secondary | ICD-10-CM

## 2024-11-03 LAB — POCT INFLUENZA A/B
Influenza A, POC: POSITIVE — AB
Influenza B, POC: NEGATIVE

## 2024-11-03 LAB — POC COVID19 BINAXNOW: SARS Coronavirus 2 Ag: NEGATIVE

## 2024-11-03 MED ORDER — OSELTAMIVIR PHOSPHATE 75 MG PO CAPS: 75.0000 mg | ORAL_CAPSULE | Freq: Two times a day (BID) | ORAL | 0 refills | Status: AC

## 2024-11-03 MED ORDER — HYDROCODONE BIT-HOMATROP MBR 5-1.5 MG/5ML PO SOLN: 5.0000 mL | Freq: Three times a day (TID) | ORAL | 0 refills | Status: AC | PRN

## 2024-11-03 NOTE — Progress Notes (Signed)
 "  Acute Office Visit  Subjective:     Patient ID: Jacob Holden, male    DOB: May 26, 1996, 28 y.o.   MRN: [id number]  Chief Complaint  Patient presents with   Cough    C/o cough, fever- max 103, body aches, fatigue and nasal drainage. Sxs started 10/31/24.    HPI Patient is in today with complaints of cough, fever of 103, body aches, fatigue, and nasal drainage that began 2 days ago. Has been taking over-the-counter medication without much relief.  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  No known sick contacts though.  Review of Systems  Constitutional:  Positive for chills and fever.  HENT:  Positive for congestion and sore throat.   Respiratory:  Positive for cough. Negative for shortness of breath and wheezing.   Cardiovascular: Negative.   Gastrointestinal:  Negative for nausea and vomiting.  Musculoskeletal:  Positive for myalgias.  Neurological: Negative.   Endo/Heme/Allergies: Negative.   Psychiatric/Behavioral: Negative.     Past Medical History:  Diagnosis Date   Asthma    Eczema    History of renal stone     Social History   Socioeconomic History   Marital status: Single    Spouse name: Not on file   Number of children: Not on file   Years of education: Not on file   Highest education level: Not on file  Occupational History   Not on file  Tobacco Use   Smoking status: Never   Smokeless tobacco: Never  Substance and Sexual Activity   Alcohol use: No   Drug use: No   Sexual activity: Never  Other Topics Concern   Not on file  Social History Narrative   Married 2022   1 daughter Knoxleigh born 06/2022.    Curator at [employer].    [employer] grad 2019   Enjoys hunting   [employer] fan   Social Drivers of Health   Tobacco Use: Low Risk (11/03/2024)   Patient History    Smoking Tobacco Use: Never    Smokeless Tobacco Use: Never    Passive Exposure: Not on file  Financial Resource Strain: Not on file  Food Insecurity:  Not on file  Transportation Needs: Not on file  Physical Activity: Not on file  Stress: Not on file  Social Connections: Not on file  Intimate Partner Violence: Not on file  Depression (PHQ2-9): Low Risk (11/03/2024)   Depression (PHQ2-9)    PHQ-2 Score: 0  Alcohol Screen: Not on file  Housing: Not on file  Utilities: Not on file  Health Literacy: Not on file    Past Surgical History:  Procedure Laterality Date   KNEE SURGERY Right 2014   LAPAROSCOPIC APPENDECTOMY N/A 09/24/2014   Procedure: APPENDECTOMY LAPAROSCOPIC;  Surgeon: CHRISTELLA. Julietta Millman, MD;  Location: [facility] OR;  Service: Pediatrics;  Laterality: N/A;    Family History  Problem Relation Age of Onset   Alzheimer's disease Paternal Grandmother        Died at 65   Alzheimer's disease Paternal Grandfather        Died at 15    Allergies[1]  Medications Ordered Prior to Encounter[2]  BP 116/70   Pulse 93   Temp 98.3 F (36.8 C) (Oral)   Ht 5' 10.47 (1.79 m)   Wt 295 lb 6 oz (134 kg)   SpO2 98%   BMI 41.82 kg/m chart      Objective:    BP 116/70   Pulse 93  Temp 98.3 F (36.8 C) (Oral)   Ht 5' 10.47 (1.79 m)   Wt 295 lb 6 oz (134 kg)   SpO2 98%   BMI 41.82 kg/m    Physical Exam Vitals and nursing note reviewed.  Constitutional:      Appearance: Normal appearance. He is obese.  HENT:     Right Ear: Tympanic membrane, ear canal and external ear normal.     Left Ear: Tympanic membrane, ear canal and external ear normal.     Nose: Congestion and rhinorrhea present.     Mouth/Throat:     Mouth: Mucous membranes are moist.     Pharynx: Oropharynx is clear. Posterior oropharyngeal erythema present.  Cardiovascular:     Rate and Rhythm: Normal rate and regular rhythm.  Pulmonary:     Effort: Pulmonary effort is normal.     Breath sounds: Normal breath sounds.  Musculoskeletal:        General: Normal range of motion.     Cervical back: Normal range of motion and neck supple.  Skin:     General: Skin is warm and dry.  Neurological:     General: No focal deficit present.     Mental Status: He is alert and oriented to person, place, and time. Mental status is at baseline.  Psychiatric:        Mood and Affect: Mood normal.        Behavior: Behavior normal.        Thought Content: Thought content normal.        Judgment: Judgment normal.    Results for orders placed or performed in visit on 11/03/24  POCT Influenza A/B  Result Value Ref Range   Influenza A, POC Positive (A) Negative   Influenza B, POC Negative Negative        Assessment & Plan:   Problem List Items Addressed This Visit   None Visit Diagnoses       Acute cough    -  Primary   Relevant Orders   POC COVID-19 BinaxNow   POCT Influenza A/B (Completed)     Influenza A       Relevant Medications   oseltamivir  (TAMIFLU ) 75 MG capsule       Meds ordered this encounter  Medications   HYDROcodone  bit-homatropine (HYCODAN) 5-1.5 MG/5ML syrup    Sig: Take 5 mLs by mouth every 8 (eight) hours as needed for cough.    Dispense:  120 mL    Refill:  0   oseltamivir  (TAMIFLU ) 75 MG capsule    Sig: Take 1 capsule (75 mg total) by mouth 2 (two) times daily.    Dispense:  10 capsule    Refill:  0   Call the office if symptoms worsen or persist.  Recheck as scheduled and sooner as needed. No follow-ups on file.  Jla Reynolds B Darrik Richman, FNP       [1] Allergies Allergen Reactions   Augmentin [Amoxicillin -Pot Clavulanate] Diarrhea   Sulfa Antibiotics Nausea Only   Penicillins     Other reaction(s): Unspecified  [2] Current Outpatient Medications on File Prior to Visit  Medication Sig Dispense Refill   albuterol (PROVENTIL HFA;VENTOLIN HFA) 108 (90 Base) MCG/ACT inhaler Inhale 1-2 puffs into the lungs every 6 (six) hours as needed for wheezing or shortness of breath.     triamcinolone  cream (KENALOG ) 0.1 % Apply 1 Application topically 2 (two) times daily as needed. 80 g 1   No current  facility-administered medications on file  prior to visit.  "

## 2024-11-03 NOTE — Telephone Encounter (Signed)
 Called pharmacy but they are closed for lunch. They will not be open until after you leave. Can you resend it to make sure received.

## 2024-11-03 NOTE — Telephone Encounter (Unsigned)
 Copied from CRM #[id number]. Topic: General - Other >> Nov 03, 2024  1:45 PM Jacob Holden wrote: Reason for CRM: Patient called in to notify Dr. Cleatus that the pharmacy filled medication HYDROcodone  bit-homatropine (HYCODAN) 5-1.5 MG/5ML syrup

## 2024-11-03 NOTE — Telephone Encounter (Signed)
 Copied from CRM #[id number]. Topic: Clinical - Prescription Issue >> Nov 03, 2024  1:15 PM Jacob Holden HERO wrote: Reason for CRM: patient went to pick up meds and the pharmacy says they didn't get anything for the HYDROcodone  bit-homatropine (HYCODAN) 5-1.5 MG/5ML syrup. Patient is asking for it to be resent, he was only give the Tamiflu .
